# Patient Record
Sex: Male | Born: 1950 | Race: White | Hispanic: No | Marital: Single | Smoking: Never smoker
Health system: Southern US, Community
[De-identification: ages and names within clinical notes are randomized; demographics above are authoritative.]

## PROBLEM LIST (undated history)

## (undated) DIAGNOSIS — I10 Essential (primary) hypertension: Secondary | ICD-10-CM

## (undated) DIAGNOSIS — E785 Hyperlipidemia, unspecified: Secondary | ICD-10-CM

## (undated) HISTORY — PX: WISDOM TOOTH EXTRACTION: SHX21

## (undated) HISTORY — PX: COLONOSCOPY: SHX174

## (undated) HISTORY — DX: Hyperlipidemia, unspecified: E78.5

## (undated) HISTORY — DX: Essential (primary) hypertension: I10

## (undated) HISTORY — PX: OTHER SURGICAL HISTORY: SHX169

## (undated) HISTORY — PX: POLYPECTOMY: SHX149

---
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
## 1976-10-30 HISTORY — PX: HEMORROIDECTOMY: SUR656

## 2004-09-20 ENCOUNTER — Ambulatory Visit: Payer: Self-pay | Admitting: Internal Medicine

## 2004-10-30 HISTORY — PX: DENTAL SURGERY: SHX609

## 2012-11-15 ENCOUNTER — Encounter: Payer: Self-pay | Admitting: Internal Medicine

## 2012-12-20 ENCOUNTER — Ambulatory Visit (AMBULATORY_SURGERY_CENTER): Payer: 59 | Admitting: *Deleted

## 2012-12-20 ENCOUNTER — Telehealth: Payer: Self-pay | Admitting: *Deleted

## 2012-12-20 VITALS — Ht 69.0 in | Wt 200.0 lb

## 2012-12-20 DIAGNOSIS — Z1211 Encounter for screening for malignant neoplasm of colon: Secondary | ICD-10-CM

## 2012-12-20 MED ORDER — MOVIPREP 100 G PO SOLR: ORAL | Status: DC

## 2012-12-20 NOTE — Telephone Encounter (Signed)
Medical release for given to Leslie Wrenn for colon and path report in 2006 or 2007 at [facility] 

## 2012-12-20 NOTE — Progress Notes (Signed)
Medical release for given to Alonna Buckler for colon and path report in 2006 or 2007 at [facility]

## 2012-12-23 ENCOUNTER — Encounter: Payer: Self-pay | Admitting: Internal Medicine

## 2012-12-24 ENCOUNTER — Telehealth: Payer: Self-pay | Admitting: Internal Medicine

## 2012-12-24 NOTE — Telephone Encounter (Signed)
Received 14 pages, [facility]. Sent to Dr. Marina Goodell. 12/24/12/sd

## 2012-12-26 ENCOUNTER — Encounter: Payer: Self-pay | Admitting: Internal Medicine

## 2012-12-26 ENCOUNTER — Ambulatory Visit (AMBULATORY_SURGERY_CENTER): Payer: 59 | Admitting: Internal Medicine

## 2012-12-26 VITALS — BP 109/74 | HR 53 | Temp 97.0°F | Resp 11 | Ht 69.0 in | Wt 200.0 lb

## 2012-12-26 DIAGNOSIS — Z86010 Personal history of colonic polyps: Secondary | ICD-10-CM

## 2012-12-26 DIAGNOSIS — D126 Benign neoplasm of colon, unspecified: Secondary | ICD-10-CM

## 2012-12-26 DIAGNOSIS — Z1211 Encounter for screening for malignant neoplasm of colon: Secondary | ICD-10-CM

## 2012-12-26 MED ORDER — SODIUM CHLORIDE 0.9 % IV SOLN: 500.0000 mL | INTRAVENOUS | Status: DC

## 2012-12-26 NOTE — Patient Instructions (Addendum)

## 2012-12-26 NOTE — Progress Notes (Signed)
Patient did not experience any of the following events: a burn prior to discharge; a fall within the facility; wrong site/side/patient/procedure/implant event; or a hospital transfer or hospital admission upon discharge from the facility. (G8907) Patient did not have preoperative order for IV antibiotic SSI prophylaxis. (G8918)  

## 2012-12-26 NOTE — Progress Notes (Signed)
Called to room to assist during endoscopic procedure.  Patient ID and intended procedure confirmed with present staff. Received instructions for my participation in the procedure from the performing physician.  

## 2012-12-26 NOTE — Progress Notes (Signed)
Lidocaine-40mg IV prior to Propofol InductionPropofol given over incremental dosages 

## 2012-12-26 NOTE — Op Note (Signed)
[facility] [address] [place], [address]   COLONOSCOPY PROCEDURE REPORT  PATIENT: Dylan Norton, Dylan Norton  MR#: [id number] BIRTHDATE: Oct 03, 1951 , 61  yrs. old GENDER: Male ENDOSCOPIST: Roxy Cedar, MD REFERRED NF:[id number] Eloise Harman, M.D. PROCEDURE DATE:  12/26/2012 PROCEDURE:   Colonoscopy with snare polypectomy    x 2 ASA CLASS:   Class II INDICATIONS:Patient's personal history of adenomatous colon polyps. Index exam December 2007 in [place] with Dr. Dan Humphreys. 6 mm sessile serrated adenoma in the sigmoid colon and diverticulosis MEDICATIONS: MAC sedation, administered by CRNA and propofol (Diprivan) 300mg  IV  DESCRIPTION OF PROCEDURE:   After the risks benefits and alternatives of the procedure were thoroughly explained, informed consent was obtained.  A digital rectal exam revealed no abnormalities of the rectum.   The LB [id number]  endoscope was introduced through the anus and advanced to the cecum, which was identified by both the appendix and ileocecal valve. No adverse events experienced.   The quality of the prep was excellent, using MoviPrep  The instrument was then slowly withdrawn as the colon was fully examined.      COLON FINDINGS: Two diminutive polyps were found at the cecum and in the ascending colon.  A polypectomy was performed with a cold snare.  The resection was complete and the polyp tissue was completely retrieved.   Severe diverticulosis was noted in the transverse colon and The finding was left colon.   The colon mucosa was otherwise normal.  Retroflexed views revealed internal hemorrhoids. The time to cecum=4 minutes 02 seconds.  Withdrawal time=10 minutes 30 seconds.  The scope was withdrawn and the procedure completed. COMPLICATIONS: There were no complications.  ENDOSCOPIC IMPRESSION: 1.   Two diminutive polyps were found at the cecum and in the ascending colon; polypectomy was performed with a cold snare 2.   Severe  diverticulosis was noted in the transverse colon and left colon 3.   The colon mucosa was otherwise normal  RECOMMENDATIONS: 1. Follow up colonoscopy in 5 years   eSigned:  Roxy Cedar, MD 12/26/2012 9:20 AM cc: Jarome Matin, MD and The Patient   PATIENT NAME:  [name], [name] MR#: [id number]

## 2012-12-27 ENCOUNTER — Telehealth: Payer: Self-pay | Admitting: *Deleted

## 2012-12-27 NOTE — Telephone Encounter (Signed)
  Follow up Call-  Call back number 12/26/2012  Post procedure Call Back phone  # [phone]  Permission to leave phone message Yes     Patient questions:  Do you have a fever, pain , or abdominal swelling? no Pain Score  0 *  Have you tolerated food without any problems? yes  Have you been able to return to your normal activities? yes  Do you have any questions about your discharge instructions: Diet   no Medications  no Follow up visit  no  Do you have questions or concerns about your Care? no  Actions: * If pain score is 4 or above: No action needed, pain <4.

## 2012-12-31 ENCOUNTER — Encounter: Payer: Self-pay | Admitting: Internal Medicine

## 2014-05-25 ENCOUNTER — Other Ambulatory Visit: Payer: Self-pay | Admitting: Dermatology

## 2014-08-30 DEATH — deceased

## 2017-09-17 DIAGNOSIS — E782 Mixed hyperlipidemia: Secondary | ICD-10-CM | POA: Diagnosis not present

## 2017-09-17 DIAGNOSIS — E559 Vitamin D deficiency, unspecified: Secondary | ICD-10-CM | POA: Diagnosis not present

## 2017-11-26 DIAGNOSIS — D485 Neoplasm of uncertain behavior of skin: Secondary | ICD-10-CM | POA: Diagnosis not present

## 2017-11-26 DIAGNOSIS — Z85828 Personal history of other malignant neoplasm of skin: Secondary | ICD-10-CM | POA: Diagnosis not present

## 2017-11-26 DIAGNOSIS — L570 Actinic keratosis: Secondary | ICD-10-CM | POA: Diagnosis not present

## 2017-11-26 DIAGNOSIS — D225 Melanocytic nevi of trunk: Secondary | ICD-10-CM | POA: Diagnosis not present

## 2017-11-26 DIAGNOSIS — L739 Follicular disorder, unspecified: Secondary | ICD-10-CM | POA: Diagnosis not present

## 2017-11-26 DIAGNOSIS — L821 Other seborrheic keratosis: Secondary | ICD-10-CM | POA: Diagnosis not present

## 2017-12-17 DIAGNOSIS — Z125 Encounter for screening for malignant neoplasm of prostate: Secondary | ICD-10-CM | POA: Diagnosis not present

## 2017-12-17 DIAGNOSIS — I10 Essential (primary) hypertension: Secondary | ICD-10-CM | POA: Diagnosis not present

## 2017-12-17 DIAGNOSIS — Z0000 Encounter for general adult medical examination without abnormal findings: Secondary | ICD-10-CM | POA: Diagnosis not present

## 2018-01-05 ENCOUNTER — Encounter: Payer: Self-pay | Admitting: Internal Medicine

## 2018-01-30 ENCOUNTER — Encounter: Payer: Self-pay | Admitting: Internal Medicine

## 2018-02-25 ENCOUNTER — Encounter: Payer: Self-pay | Admitting: Internal Medicine

## 2018-04-29 ENCOUNTER — Other Ambulatory Visit: Payer: Self-pay

## 2018-04-29 ENCOUNTER — Ambulatory Visit (AMBULATORY_SURGERY_CENTER): Payer: Self-pay | Admitting: *Deleted

## 2018-04-29 ENCOUNTER — Encounter: Payer: Self-pay | Admitting: Internal Medicine

## 2018-04-29 VITALS — Ht 68.5 in | Wt 167.8 lb

## 2018-04-29 DIAGNOSIS — Z86010 Personal history of colonic polyps: Secondary | ICD-10-CM

## 2018-04-29 MED ORDER — PEG-KCL-NACL-NASULF-NA ASC-C 140 G PO SOLR: 1.0000 | ORAL | 0 refills | Status: DC

## 2018-04-29 NOTE — Progress Notes (Signed)
No egg or soy allergy known to patient  No issues with past sedation with any surgeries  or procedures, no intubation problems  No diet pills per patient No home 02 use per patient  No blood thinners per patient  Pt denies issues with constipation  No A fib or A flutter  EMMI video sent to pt's e mail - pt declined  Plenvu sample given due to insurance  Lot [id number] exp 04-2019 as directed

## 2018-05-13 ENCOUNTER — Other Ambulatory Visit: Payer: Self-pay

## 2018-05-13 ENCOUNTER — Ambulatory Visit (AMBULATORY_SURGERY_CENTER): Payer: Medicare Other | Admitting: Internal Medicine

## 2018-05-13 ENCOUNTER — Encounter: Payer: Self-pay | Admitting: Internal Medicine

## 2018-05-13 VITALS — BP 154/78 | HR 52 | Temp 99.1°F | Resp 13 | Ht 68.0 in | Wt 167.0 lb

## 2018-05-13 DIAGNOSIS — Z86010 Personal history of colonic polyps: Secondary | ICD-10-CM | POA: Diagnosis not present

## 2018-05-13 DIAGNOSIS — Z1211 Encounter for screening for malignant neoplasm of colon: Secondary | ICD-10-CM | POA: Diagnosis not present

## 2018-05-13 MED ORDER — SODIUM CHLORIDE 0.9 % IV SOLN: 500.0000 mL | Freq: Once | INTRAVENOUS | Status: AC

## 2018-05-13 NOTE — Op Note (Signed)
[facility] Patient Name: Dylan Norton Procedure Date: 05/13/2018 9:37 AM MRN: [id number] Endoscopist: Docia Chuck. Henrene Pastor , MD Age: 67 Referring MD:  Date of Birth: Dec 24, 1950 Gender: Male Account #: [id number] Procedure:                Colonoscopy Indications:              High risk colon cancer surveillance: Personal                            history of non-advanced adenoma, High risk colon                            cancer surveillance: Personal history of sessile                            serrated colon polyp (less than 10 mm in size) with                            no dysplasia. Previous examinations 2007                            (elsewhere) and 2014 Medicines:                Monitored Anesthesia Care Procedure:                Pre-Anesthesia Assessment:                           - Prior to the procedure, a History and Physical                            was performed, and patient medications and                            allergies were reviewed. The patient's tolerance of                            previous anesthesia was also reviewed. The risks                            and benefits of the procedure and the sedation                            options and risks were discussed with the patient.                            All questions were answered, and informed consent                            was obtained. Prior Anticoagulants: The patient has                            taken no previous anticoagulant or antiplatelet  agents. ASA Grade Assessment: II - A patient with                            mild systemic disease. After reviewing the risks                            and benefits, the patient was deemed in                            satisfactory condition to undergo the procedure.                           After obtaining informed consent, the colonoscope                            was passed under direct vision. Throughout the                   procedure, the patient's blood pressure, pulse, and                            oxygen saturations were monitored continuously. The                            Colonoscope was introduced through the anus and                            advanced to the the cecum, identified by                            appendiceal orifice and ileocecal valve. The                            ileocecal valve, appendiceal orifice, and rectum                            were photographed. The quality of the bowel                            preparation was good. The colonoscopy was performed                            without difficulty. The patient tolerated the                            procedure well. The bowel preparation used was                            SUPREP. Scope In: 9:50:25 AM Scope Out: 10:03:20 AM Scope Withdrawal Time: 0 hours 8 minutes 12 seconds  Total Procedure Duration: 0 hours 12 minutes 55 seconds  Findings:                 Multiple small and large-mouthed diverticula were  found in the left colon.                           Internal hemorrhoids were found during retroflexion.                           The exam was otherwise without abnormality on                            direct and retroflexion views. Complications:            No immediate complications. Estimated blood loss:                            None. Estimated Blood Loss:     Estimated blood loss: none. Impression:               - Diverticulosis in the left colon.                           - Internal hemorrhoids.                           - The examination was otherwise normal on direct                            and retroflexion views.                           - No specimens collected. Recommendation:           - Repeat colonoscopy in 5 years for surveillance                            (personal history of multiple adenomas and SSP).                           - Patient has a contact  number available for                            emergencies. The signs and symptoms of potential                            delayed complications were discussed with the                            patient. Return to normal activities tomorrow.                            Written discharge instructions were provided to the                            patient.                           - Resume previous diet.                           -  Continue present medications. Docia Chuck. Henrene Pastor, MD 05/13/2018 10:07:58 AM This report has been signed electronically.

## 2018-05-13 NOTE — Progress Notes (Signed)
A and O x3. Report to RN. Tolerated MAC anesthesia well.

## 2018-05-13 NOTE — Patient Instructions (Signed)
Handouts given:  Diverticulosis  YOU HAD AN ENDOSCOPIC PROCEDURE TODAY AT [facility]:   Refer to the procedure report that was given to you for any specific questions about what was found during the examination.  If the procedure report does not answer your questions, please call your gastroenterologist to clarify.  If you requested that your care partner not be given the details of your procedure findings, then the procedure report has been included in a sealed envelope for you to review at your convenience later.  YOU SHOULD EXPECT: Some feelings of bloating in the abdomen. Passage of more gas than usual.  Walking can help get rid of the air that was put into your GI tract during the procedure and reduce the bloating. If you had a lower endoscopy (such as a colonoscopy or flexible sigmoidoscopy) you may notice spotting of blood in your stool or on the toilet paper. If you underwent a bowel prep for your procedure, you may not have a normal bowel movement for a few days.  Please Note:  You might notice some irritation and congestion in your nose or some drainage.  This is from the oxygen used during your procedure.  There is no need for concern and it should clear up in a day or so.  SYMPTOMS TO REPORT IMMEDIATELY:   Following lower endoscopy (colonoscopy or flexible sigmoidoscopy):  Excessive amounts of blood in the stool  Significant tenderness or worsening of abdominal pains  Swelling of the abdomen that is new, acute  Fever of 100F or higher   For urgent or emergent issues, a gastroenterologist can be reached at any hour by calling [phone].   DIET:  We do recommend a small meal at first, but then you may proceed to your regular diet.  Drink plenty of fluids but you should avoid alcoholic beverages for 24 hours.  ACTIVITY:  You should plan to take it easy for the rest of today and you should NOT DRIVE or use heavy machinery until tomorrow (because of the sedation  medicines used during the test).    FOLLOW UP: Our staff will call the number listed on your records the next business day following your procedure to check on you and address any questions or concerns that you may have regarding the information given to you following your procedure. If we do not reach you, we will leave a message.  However, if you are feeling well and you are not experiencing any problems, there is no need to return our call.  We will assume that you have returned to your regular daily activities without incident.  If any biopsies were taken you will be contacted by phone or by letter within the next 1-3 weeks.  Please call us at [phone] if you have not heard about the biopsies in 3 weeks.    SIGNATURES/CONFIDENTIALITY: You and/or your care partner have signed paperwork which will be entered into your electronic medical record.  These signatures attest to the fact that that the information above on your After Visit Summary has been reviewed and is understood.  Full responsibility of the confidentiality of this discharge information lies with you and/or your care-partner.

## 2018-05-14 ENCOUNTER — Telehealth: Payer: Self-pay

## 2018-05-14 NOTE — Telephone Encounter (Signed)
  Follow up Call-  Call back number 05/13/2018  Post procedure Call Back phone  # [phone]  Permission to leave phone message Yes  Some recent data might be hidden     LMOM; will try again later today Angela/Call-back/Recovery

## 2018-05-14 NOTE — Telephone Encounter (Signed)
  Follow up Call-  Call back number 05/13/2018  Post procedure Call Back phone  # [phone]  Permission to leave phone message Yes  Some recent data might be hidden     Patient questions:  Do you have a fever, pain , or abdominal swelling? No. Pain Score  0 *  Have you tolerated food without any problems? Yes.    Have you been able to return to your normal activities? Yes.    Do you have any questions about your discharge instructions: Diet   No. Medications  No. Follow up visit  No.  Do you have questions or concerns about your Care? No.  Actions: * If pain score is 4 or above: No action needed, pain <4.

## 2018-05-27 DIAGNOSIS — Z85828 Personal history of other malignant neoplasm of skin: Secondary | ICD-10-CM | POA: Diagnosis not present

## 2018-05-27 DIAGNOSIS — D225 Melanocytic nevi of trunk: Secondary | ICD-10-CM | POA: Diagnosis not present

## 2018-05-27 DIAGNOSIS — D2262 Melanocytic nevi of left upper limb, including shoulder: Secondary | ICD-10-CM | POA: Diagnosis not present

## 2018-05-27 DIAGNOSIS — L821 Other seborrheic keratosis: Secondary | ICD-10-CM | POA: Diagnosis not present

## 2018-06-17 DIAGNOSIS — C61 Malignant neoplasm of prostate: Secondary | ICD-10-CM | POA: Diagnosis not present

## 2018-06-24 DIAGNOSIS — N400 Enlarged prostate without lower urinary tract symptoms: Secondary | ICD-10-CM | POA: Diagnosis not present

## 2018-06-24 DIAGNOSIS — C61 Malignant neoplasm of prostate: Secondary | ICD-10-CM | POA: Diagnosis not present

## 2018-08-03 DIAGNOSIS — Z23 Encounter for immunization: Secondary | ICD-10-CM | POA: Diagnosis not present

## 2018-08-30 DIAGNOSIS — M7582 Other shoulder lesions, left shoulder: Secondary | ICD-10-CM | POA: Diagnosis not present

## 2018-08-30 DIAGNOSIS — M542 Cervicalgia: Secondary | ICD-10-CM | POA: Diagnosis not present

## 2018-11-08 DIAGNOSIS — H2513 Age-related nuclear cataract, bilateral: Secondary | ICD-10-CM | POA: Diagnosis not present

## 2018-11-08 DIAGNOSIS — H5203 Hypermetropia, bilateral: Secondary | ICD-10-CM | POA: Diagnosis not present

## 2018-11-08 DIAGNOSIS — H52203 Unspecified astigmatism, bilateral: Secondary | ICD-10-CM | POA: Diagnosis not present

## 2018-11-08 DIAGNOSIS — H524 Presbyopia: Secondary | ICD-10-CM | POA: Diagnosis not present

## 2018-12-10 DIAGNOSIS — Z0120 Encounter for dental examination and cleaning without abnormal findings: Secondary | ICD-10-CM | POA: Diagnosis not present

## 2018-12-20 DIAGNOSIS — R82998 Other abnormal findings in urine: Secondary | ICD-10-CM | POA: Diagnosis not present

## 2018-12-20 DIAGNOSIS — Z0000 Encounter for general adult medical examination without abnormal findings: Secondary | ICD-10-CM | POA: Diagnosis not present

## 2018-12-20 DIAGNOSIS — R809 Proteinuria, unspecified: Secondary | ICD-10-CM | POA: Diagnosis not present

## 2018-12-20 DIAGNOSIS — Z125 Encounter for screening for malignant neoplasm of prostate: Secondary | ICD-10-CM | POA: Diagnosis not present

## 2018-12-27 DIAGNOSIS — R972 Elevated prostate specific antigen [PSA]: Secondary | ICD-10-CM | POA: Diagnosis not present

## 2018-12-30 DIAGNOSIS — Z1212 Encounter for screening for malignant neoplasm of rectum: Secondary | ICD-10-CM | POA: Diagnosis not present

## 2019-01-07 ENCOUNTER — Other Ambulatory Visit: Payer: Self-pay | Admitting: Urology

## 2019-01-07 DIAGNOSIS — R972 Elevated prostate specific antigen [PSA]: Secondary | ICD-10-CM | POA: Diagnosis not present

## 2019-01-07 DIAGNOSIS — C61 Malignant neoplasm of prostate: Secondary | ICD-10-CM | POA: Diagnosis not present

## 2019-01-07 DIAGNOSIS — N400 Enlarged prostate without lower urinary tract symptoms: Secondary | ICD-10-CM | POA: Diagnosis not present

## 2019-01-21 ENCOUNTER — Other Ambulatory Visit: Payer: Self-pay

## 2019-01-21 ENCOUNTER — Ambulatory Visit
Admission: RE | Admit: 2019-01-21 | Discharge: 2019-01-21 | Disposition: A | Discharge status: Home / Self Care | Payer: Medicare Other | Source: Ambulatory Visit | Attending: Urology | Admitting: Urology

## 2019-01-21 DIAGNOSIS — C61 Malignant neoplasm of prostate: Secondary | ICD-10-CM | POA: Diagnosis not present

## 2019-01-21 MED ORDER — GADOBENATE DIMEGLUMINE 529 MG/ML IV SOLN
15.0000 mL | Freq: Once | INTRAVENOUS | Status: AC | PRN
  Administered 2019-01-21: 15 mL via INTRAVENOUS

## 2019-04-14 DIAGNOSIS — C61 Malignant neoplasm of prostate: Secondary | ICD-10-CM | POA: Diagnosis not present

## 2019-05-26 DIAGNOSIS — D2372 Other benign neoplasm of skin of left lower limb, including hip: Secondary | ICD-10-CM | POA: Diagnosis not present

## 2019-05-26 DIAGNOSIS — D1801 Hemangioma of skin and subcutaneous tissue: Secondary | ICD-10-CM | POA: Diagnosis not present

## 2019-05-26 DIAGNOSIS — D225 Melanocytic nevi of trunk: Secondary | ICD-10-CM | POA: Diagnosis not present

## 2019-05-26 DIAGNOSIS — L821 Other seborrheic keratosis: Secondary | ICD-10-CM | POA: Diagnosis not present

## 2019-07-03 DIAGNOSIS — N400 Enlarged prostate without lower urinary tract symptoms: Secondary | ICD-10-CM | POA: Diagnosis not present

## 2019-07-03 DIAGNOSIS — C61 Malignant neoplasm of prostate: Secondary | ICD-10-CM | POA: Diagnosis not present

## 2019-07-12 DIAGNOSIS — Z23 Encounter for immunization: Secondary | ICD-10-CM | POA: Diagnosis not present

## 2019-11-10 DIAGNOSIS — H52203 Unspecified astigmatism, bilateral: Secondary | ICD-10-CM | POA: Diagnosis not present

## 2019-11-10 DIAGNOSIS — H5203 Hypermetropia, bilateral: Secondary | ICD-10-CM | POA: Diagnosis not present

## 2019-11-10 DIAGNOSIS — H2513 Age-related nuclear cataract, bilateral: Secondary | ICD-10-CM | POA: Diagnosis not present

## 2019-11-10 DIAGNOSIS — H524 Presbyopia: Secondary | ICD-10-CM | POA: Diagnosis not present

## 2019-11-18 ENCOUNTER — Other Ambulatory Visit: Payer: Self-pay

## 2019-11-22 ENCOUNTER — Ambulatory Visit: Payer: Medicare Other | Attending: Internal Medicine

## 2019-11-22 DIAGNOSIS — Z23 Encounter for immunization: Secondary | ICD-10-CM | POA: Insufficient documentation

## 2019-11-22 NOTE — Progress Notes (Signed)
   Covid-19 Vaccination Clinic  Name:  Dylan Norton    MRN: [id number] DOB: September 07, 1951  11/22/2019  Dylan Norton was observed post Covid-19 immunization for 15 minutes without incidence. He was provided with Vaccine Information Sheet and instruction to access the V-Safe system.   Dylan Norton was instructed to call 911 with any severe reactions post vaccine: Marland Kitchen Difficulty breathing  . Swelling of your face and throat  . A fast heartbeat  . A bad rash all over your body  . Dizziness and weakness    Immunizations Administered    Name Date Dose VIS Date Route   Pfizer COVID-19 Vaccine 11/22/2019 12:41 PM 0.3 mL 10/10/2019 Intramuscular   Manufacturer: [place]   Lot: [id number]   [place]: [id number]

## 2019-12-13 ENCOUNTER — Ambulatory Visit: Payer: Medicare Other | Attending: Internal Medicine

## 2019-12-13 DIAGNOSIS — Z23 Encounter for immunization: Secondary | ICD-10-CM | POA: Insufficient documentation

## 2019-12-13 NOTE — Progress Notes (Signed)
   Covid-19 Vaccination Clinic  Name:  Dylan Norton    MRN: [id number] DOB: 07-17-1951  12/13/2019  Dylan Norton was observed post Covid-19 immunization for 15 minutes without incidence. He was provided with Vaccine Information Sheet and instruction to access the V-Safe system.   Dylan Norton was instructed to call 911 with any severe reactions post vaccine: Marland Kitchen Difficulty breathing  . Swelling of your face and throat  . A fast heartbeat  . A bad rash all over your body  . Dizziness and weakness    Immunizations Administered    Name Date Dose VIS Date Route   Pfizer COVID-19 Vaccine 12/13/2019 10:45 AM 0.3 mL 10/10/2019 Intramuscular   Manufacturer: [employer]   Lot: [id number]   [place]: [id number]

## 2020-01-02 DIAGNOSIS — Z125 Encounter for screening for malignant neoplasm of prostate: Secondary | ICD-10-CM | POA: Diagnosis not present

## 2020-01-02 DIAGNOSIS — Z0000 Encounter for general adult medical examination without abnormal findings: Secondary | ICD-10-CM | POA: Diagnosis not present

## 2020-01-02 DIAGNOSIS — E7849 Other hyperlipidemia: Secondary | ICD-10-CM | POA: Diagnosis not present

## 2020-01-09 DIAGNOSIS — I10 Essential (primary) hypertension: Secondary | ICD-10-CM | POA: Diagnosis not present

## 2020-01-09 DIAGNOSIS — R82998 Other abnormal findings in urine: Secondary | ICD-10-CM | POA: Diagnosis not present

## 2020-01-23 DIAGNOSIS — Z1212 Encounter for screening for malignant neoplasm of rectum: Secondary | ICD-10-CM | POA: Diagnosis not present

## 2020-05-17 DIAGNOSIS — D2261 Melanocytic nevi of right upper limb, including shoulder: Secondary | ICD-10-CM | POA: Diagnosis not present

## 2020-05-17 DIAGNOSIS — L820 Inflamed seborrheic keratosis: Secondary | ICD-10-CM | POA: Diagnosis not present

## 2020-05-17 DIAGNOSIS — L821 Other seborrheic keratosis: Secondary | ICD-10-CM | POA: Diagnosis not present

## 2020-05-17 DIAGNOSIS — D225 Melanocytic nevi of trunk: Secondary | ICD-10-CM | POA: Diagnosis not present

## 2020-05-17 DIAGNOSIS — D1801 Hemangioma of skin and subcutaneous tissue: Secondary | ICD-10-CM | POA: Diagnosis not present

## 2020-06-15 DIAGNOSIS — Z0120 Encounter for dental examination and cleaning without abnormal findings: Secondary | ICD-10-CM | POA: Diagnosis not present

## 2020-07-08 DIAGNOSIS — C61 Malignant neoplasm of prostate: Secondary | ICD-10-CM | POA: Diagnosis not present

## 2020-07-15 DIAGNOSIS — C61 Malignant neoplasm of prostate: Secondary | ICD-10-CM | POA: Diagnosis not present

## 2020-07-15 DIAGNOSIS — R972 Elevated prostate specific antigen [PSA]: Secondary | ICD-10-CM | POA: Diagnosis not present

## 2020-07-16 ENCOUNTER — Other Ambulatory Visit: Payer: Self-pay | Admitting: Urology

## 2020-07-16 DIAGNOSIS — C61 Malignant neoplasm of prostate: Secondary | ICD-10-CM

## 2020-07-26 DIAGNOSIS — Z23 Encounter for immunization: Secondary | ICD-10-CM | POA: Diagnosis not present

## 2020-07-28 ENCOUNTER — Other Ambulatory Visit: Payer: Self-pay

## 2020-07-28 ENCOUNTER — Ambulatory Visit
Admission: RE | Admit: 2020-07-28 | Discharge: 2020-07-28 | Disposition: A | Discharge status: Home / Self Care | Payer: Medicare Other | Source: Ambulatory Visit | Attending: Urology | Admitting: Urology

## 2020-07-28 DIAGNOSIS — C61 Malignant neoplasm of prostate: Secondary | ICD-10-CM

## 2020-07-28 DIAGNOSIS — R972 Elevated prostate specific antigen [PSA]: Secondary | ICD-10-CM | POA: Diagnosis not present

## 2020-07-28 MED ORDER — GADOBENATE DIMEGLUMINE 529 MG/ML IV SOLN
15.0000 mL | Freq: Once | INTRAVENOUS | Status: AC | PRN
  Administered 2020-07-28: 15 mL via INTRAVENOUS

## 2020-07-30 DIAGNOSIS — M79671 Pain in right foot: Secondary | ICD-10-CM | POA: Diagnosis not present

## 2020-07-30 DIAGNOSIS — M205X9 Other deformities of toe(s) (acquired), unspecified foot: Secondary | ICD-10-CM | POA: Insufficient documentation

## 2020-07-30 DIAGNOSIS — M205X1 Other deformities of toe(s) (acquired), right foot: Secondary | ICD-10-CM | POA: Diagnosis not present

## 2020-07-30 DIAGNOSIS — M79672 Pain in left foot: Secondary | ICD-10-CM | POA: Diagnosis not present

## 2020-10-12 DIAGNOSIS — H524 Presbyopia: Secondary | ICD-10-CM | POA: Diagnosis not present

## 2020-10-12 DIAGNOSIS — H2513 Age-related nuclear cataract, bilateral: Secondary | ICD-10-CM | POA: Diagnosis not present

## 2020-10-12 DIAGNOSIS — H52203 Unspecified astigmatism, bilateral: Secondary | ICD-10-CM | POA: Diagnosis not present

## 2020-10-12 DIAGNOSIS — H5203 Hypermetropia, bilateral: Secondary | ICD-10-CM | POA: Diagnosis not present

## 2020-12-20 DIAGNOSIS — M65341 Trigger finger, right ring finger: Secondary | ICD-10-CM | POA: Diagnosis not present

## 2020-12-20 DIAGNOSIS — M79641 Pain in right hand: Secondary | ICD-10-CM | POA: Insufficient documentation

## 2020-12-20 DIAGNOSIS — M13841 Other specified arthritis, right hand: Secondary | ICD-10-CM | POA: Diagnosis not present

## 2020-12-20 DIAGNOSIS — M6530 Trigger finger, unspecified finger: Secondary | ICD-10-CM | POA: Insufficient documentation

## 2021-01-14 DIAGNOSIS — Z125 Encounter for screening for malignant neoplasm of prostate: Secondary | ICD-10-CM | POA: Diagnosis not present

## 2021-01-14 DIAGNOSIS — E785 Hyperlipidemia, unspecified: Secondary | ICD-10-CM | POA: Diagnosis not present

## 2021-01-21 DIAGNOSIS — Z1212 Encounter for screening for malignant neoplasm of rectum: Secondary | ICD-10-CM | POA: Diagnosis not present

## 2021-01-21 DIAGNOSIS — R82998 Other abnormal findings in urine: Secondary | ICD-10-CM | POA: Diagnosis not present

## 2021-01-21 DIAGNOSIS — I10 Essential (primary) hypertension: Secondary | ICD-10-CM | POA: Diagnosis not present

## 2021-03-24 DIAGNOSIS — M65341 Trigger finger, right ring finger: Secondary | ICD-10-CM | POA: Diagnosis not present

## 2021-05-18 DIAGNOSIS — L570 Actinic keratosis: Secondary | ICD-10-CM | POA: Insufficient documentation

## 2021-05-18 DIAGNOSIS — M25572 Pain in left ankle and joints of left foot: Secondary | ICD-10-CM | POA: Diagnosis not present

## 2021-05-18 DIAGNOSIS — M79671 Pain in right foot: Secondary | ICD-10-CM | POA: Diagnosis not present

## 2021-05-18 DIAGNOSIS — S92413A Displaced fracture of proximal phalanx of unspecified great toe, initial encounter for closed fracture: Secondary | ICD-10-CM | POA: Insufficient documentation

## 2021-05-18 DIAGNOSIS — M21542 Acquired clubfoot, left foot: Secondary | ICD-10-CM | POA: Insufficient documentation

## 2021-05-18 DIAGNOSIS — M79672 Pain in left foot: Secondary | ICD-10-CM | POA: Diagnosis not present

## 2021-05-18 DIAGNOSIS — M25571 Pain in right ankle and joints of right foot: Secondary | ICD-10-CM | POA: Diagnosis not present

## 2021-05-19 DIAGNOSIS — L814 Other melanin hyperpigmentation: Secondary | ICD-10-CM | POA: Diagnosis not present

## 2021-05-19 DIAGNOSIS — D692 Other nonthrombocytopenic purpura: Secondary | ICD-10-CM | POA: Diagnosis not present

## 2021-05-19 DIAGNOSIS — L821 Other seborrheic keratosis: Secondary | ICD-10-CM | POA: Diagnosis not present

## 2021-05-19 DIAGNOSIS — D225 Melanocytic nevi of trunk: Secondary | ICD-10-CM | POA: Diagnosis not present

## 2021-06-17 DIAGNOSIS — S92512A Displaced fracture of proximal phalanx of left lesser toe(s), initial encounter for closed fracture: Secondary | ICD-10-CM | POA: Diagnosis not present

## 2021-06-17 DIAGNOSIS — M21549 Acquired clubfoot, unspecified foot: Secondary | ICD-10-CM | POA: Diagnosis not present

## 2021-06-17 DIAGNOSIS — L84 Corns and callosities: Secondary | ICD-10-CM | POA: Diagnosis not present

## 2021-07-19 DIAGNOSIS — M21549 Acquired clubfoot, unspecified foot: Secondary | ICD-10-CM | POA: Diagnosis not present

## 2021-07-19 DIAGNOSIS — C61 Malignant neoplasm of prostate: Secondary | ICD-10-CM | POA: Diagnosis not present

## 2021-07-25 DIAGNOSIS — R351 Nocturia: Secondary | ICD-10-CM | POA: Diagnosis not present

## 2021-07-25 DIAGNOSIS — N401 Enlarged prostate with lower urinary tract symptoms: Secondary | ICD-10-CM | POA: Diagnosis not present

## 2021-07-25 DIAGNOSIS — C61 Malignant neoplasm of prostate: Secondary | ICD-10-CM | POA: Diagnosis not present

## 2021-07-26 ENCOUNTER — Other Ambulatory Visit: Payer: Self-pay | Admitting: Urology

## 2021-07-26 DIAGNOSIS — C61 Malignant neoplasm of prostate: Secondary | ICD-10-CM

## 2021-08-01 DIAGNOSIS — Z23 Encounter for immunization: Secondary | ICD-10-CM | POA: Diagnosis not present

## 2021-08-07 ENCOUNTER — Ambulatory Visit
Admission: RE | Admit: 2021-08-07 | Discharge: 2021-08-07 | Disposition: A | Discharge status: Home / Self Care | Payer: Medicare Other | Source: Ambulatory Visit | Attending: Urology | Admitting: Urology

## 2021-08-07 ENCOUNTER — Other Ambulatory Visit: Payer: Self-pay

## 2021-08-07 DIAGNOSIS — K5730 Diverticulosis of large intestine without perforation or abscess without bleeding: Secondary | ICD-10-CM | POA: Diagnosis not present

## 2021-08-07 DIAGNOSIS — N3289 Other specified disorders of bladder: Secondary | ICD-10-CM | POA: Diagnosis not present

## 2021-08-07 DIAGNOSIS — C61 Malignant neoplasm of prostate: Secondary | ICD-10-CM | POA: Diagnosis not present

## 2021-08-07 MED ORDER — GADOBENATE DIMEGLUMINE 529 MG/ML IV SOLN
15.0000 mL | Freq: Once | INTRAVENOUS | Status: AC | PRN
  Administered 2021-08-07: 15 mL via INTRAVENOUS

## 2021-08-10 DIAGNOSIS — C61 Malignant neoplasm of prostate: Secondary | ICD-10-CM | POA: Diagnosis not present

## 2021-10-13 DIAGNOSIS — H524 Presbyopia: Secondary | ICD-10-CM | POA: Diagnosis not present

## 2021-10-13 DIAGNOSIS — H52203 Unspecified astigmatism, bilateral: Secondary | ICD-10-CM | POA: Diagnosis not present

## 2021-10-13 DIAGNOSIS — H5203 Hypermetropia, bilateral: Secondary | ICD-10-CM | POA: Diagnosis not present

## 2021-10-13 DIAGNOSIS — H2513 Age-related nuclear cataract, bilateral: Secondary | ICD-10-CM | POA: Diagnosis not present

## 2022-03-06 DIAGNOSIS — Z125 Encounter for screening for malignant neoplasm of prostate: Secondary | ICD-10-CM | POA: Diagnosis not present

## 2022-03-06 DIAGNOSIS — I10 Essential (primary) hypertension: Secondary | ICD-10-CM | POA: Diagnosis not present

## 2022-03-06 DIAGNOSIS — E785 Hyperlipidemia, unspecified: Secondary | ICD-10-CM | POA: Diagnosis not present

## 2022-03-06 DIAGNOSIS — I2510 Atherosclerotic heart disease of native coronary artery without angina pectoris: Secondary | ICD-10-CM | POA: Diagnosis not present

## 2022-03-13 DIAGNOSIS — R82998 Other abnormal findings in urine: Secondary | ICD-10-CM | POA: Diagnosis not present

## 2022-03-13 DIAGNOSIS — G4700 Insomnia, unspecified: Secondary | ICD-10-CM | POA: Diagnosis not present

## 2022-03-13 DIAGNOSIS — Z0000 Encounter for general adult medical examination without abnormal findings: Secondary | ICD-10-CM | POA: Diagnosis not present

## 2022-03-13 DIAGNOSIS — I10 Essential (primary) hypertension: Secondary | ICD-10-CM | POA: Diagnosis not present

## 2022-03-13 DIAGNOSIS — I2510 Atherosclerotic heart disease of native coronary artery without angina pectoris: Secondary | ICD-10-CM | POA: Diagnosis not present

## 2022-04-24 DIAGNOSIS — I10 Essential (primary) hypertension: Secondary | ICD-10-CM | POA: Diagnosis not present

## 2022-04-26 ENCOUNTER — Ambulatory Visit: Payer: Medicare Other | Admitting: Podiatry

## 2022-04-26 DIAGNOSIS — M21961 Unspecified acquired deformity of right lower leg: Secondary | ICD-10-CM

## 2022-04-26 DIAGNOSIS — M2041 Other hammer toe(s) (acquired), right foot: Secondary | ICD-10-CM | POA: Diagnosis not present

## 2022-04-26 DIAGNOSIS — M2011 Hallux valgus (acquired), right foot: Secondary | ICD-10-CM | POA: Diagnosis not present

## 2022-04-29 ENCOUNTER — Encounter: Payer: Self-pay | Admitting: Podiatry

## 2022-04-29 NOTE — Progress Notes (Signed)
  Subjective:  Patient ID: Dylan Norton, male    DOB: 08-Mar-1951,  MRN: [id number]  Chief Complaint  Patient presents with   Bunions    NP overlapping of halux - 2nd opinion    71 y.o. male presents with the above complaint. History confirmed with patient.  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  His great toe overlaps the second toe.  He saw Dr. Doran Durand and had x-rays and he recommended first metatarsal phalangeal joint fusion, he would not want to have surgery right now.  He has tried multiple types of offloading pads.  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 Hallux interphalangeus, acquired, right   2. Hammertoe of right foot   3. Metatarsal deformity, right      Plan:  Patient was evaluated and treated and all questions answered.  We discussed surgical and nonsurgical treatment options.  He is not interested in surgery currently.  Currently its not particularly painful and limiting for him.  He will continue his nonsurgical treatment with offloading pads.  If he did consider surgery I do not think necessarily a first metatarsophalangeal joint fusion is necessary, he likely could have correction of the deformity with a large Aiken osteotomy, Weil osteotomies of the second third and fourth metatarsals to shorten these and reduce the hammertoe contracture.  He will return to see me as needed if he is interested in surgery.  Return if symptoms worsen or fail to  improve.

## 2022-05-25 DIAGNOSIS — L821 Other seborrheic keratosis: Secondary | ICD-10-CM | POA: Diagnosis not present

## 2022-05-25 DIAGNOSIS — D2261 Melanocytic nevi of right upper limb, including shoulder: Secondary | ICD-10-CM | POA: Diagnosis not present

## 2022-05-25 DIAGNOSIS — D225 Melanocytic nevi of trunk: Secondary | ICD-10-CM | POA: Diagnosis not present

## 2022-05-25 DIAGNOSIS — D2262 Melanocytic nevi of left upper limb, including shoulder: Secondary | ICD-10-CM | POA: Diagnosis not present

## 2022-07-17 DIAGNOSIS — C61 Malignant neoplasm of prostate: Secondary | ICD-10-CM | POA: Diagnosis not present

## 2022-07-23 IMAGING — MR MR PROSTATE WO/W CM
12 series · 48 of 48 positions shown · IV contrast (multihance)
Comparison: 07/28/2020

CLINICAL DATA: Prostate carcinoma, Gleason score 6. Active
surveillance.

EXAM:
MR PROSTATE WITHOUT AND WITH CONTRAST
TECHNIQUE: Multiplanar multisequence MRI images were obtained of the pelvis
centered about the prostate. Pre and post contrast images were
obtained.
CONTRAST:  15mL MULTIHANCE GADOBENATE DIMEGLUMINE 529 MG/ML IV SOLN

[Series 3: T2 · coronal · 3.0mm · 0.56mm/px · 1 of 25 slices shown (1 of 3)]
[im 1/25]
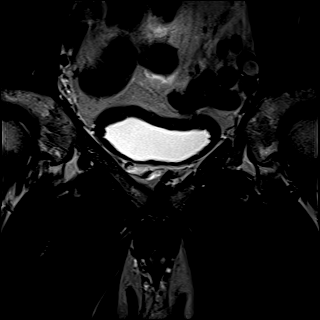

[Series 4: T1 · axial · 5.0mm · 1.25mm/px · 1 of 80 slices shown]
[im 1/80]
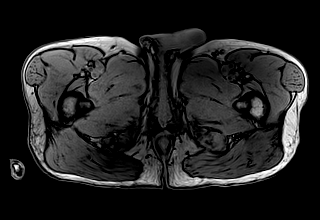

[Series 5: DWI · axial · 3.0mm · 1.75mm/px · 1 of 84 slices shown (1 of 3)]
[im 1/84]
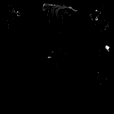

[Series 6: DWI · axial · 3.0mm · 1.75mm/px · 1 of 28 slices shown (2 of 3)]
[im 1/28]
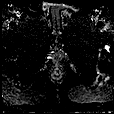

[Series 7: DWI · axial · 3.0mm · 1.75mm/px · 1 of 28 slices shown (3 of 3)]
[im 1/28]
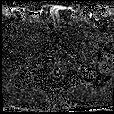

[Series 8: T2 · axial · 3.0mm · 0.62mm/px · 1 of 28 slices shown (2 of 3)]
[im 1/28]
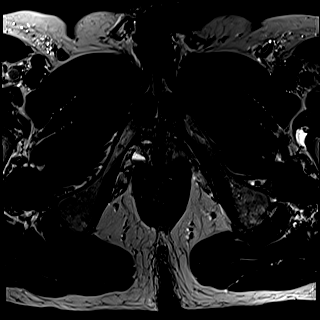

[Series 9: T2 · axial · 1.0mm · 1.04mm/px · z∈[-46,+33]mm · 2 of 80 slices shown (3 of 3)]
[im 1/80]
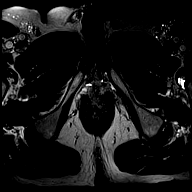
[im 80/80]
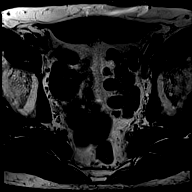

[Series 10: pre t1_twist_tra_dyn · axial · non-contrast · 3.5mm · 0.94mm/px · 1 of 24 slices shown]
[im 1/24]
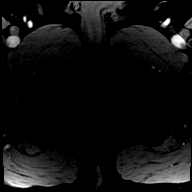

[Series 11: post t1_twist_tra_dyn-copy center · axial · non-contrast · 3.5mm · 0.94mm/px · z∈[-47,+34]mm · 18 of 720 slices shown]
[im 1/720]
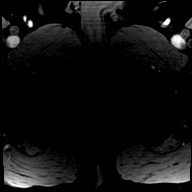
[im 43/720]
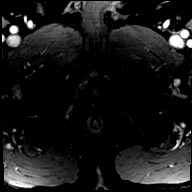
[im 85/720]
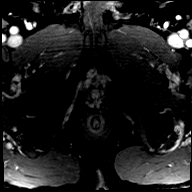
[im 127/720]
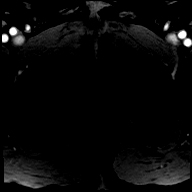
[im 170/720]
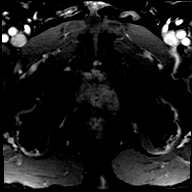
[im 212/720]
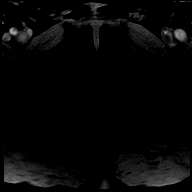
[im 254/720]
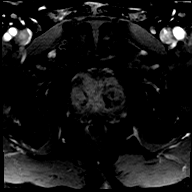
[im 297/720]
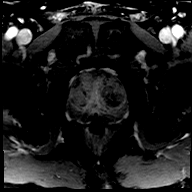
[im 339/720]
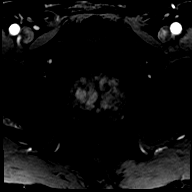
[im 381/720]
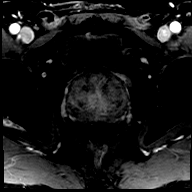
[im 423/720]
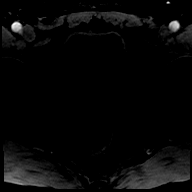
[im 466/720]
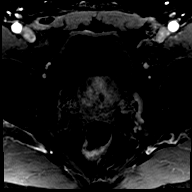
[im 508/720]
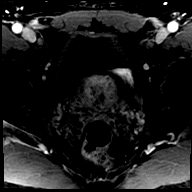
[im 550/720]
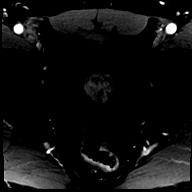
[im 593/720]
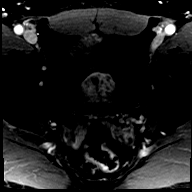
[im 635/720]
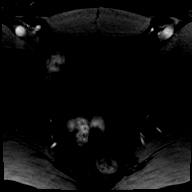
[im 677/720]
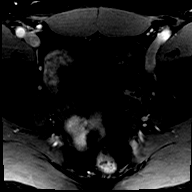
[im 720/720]
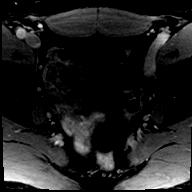

[Series 12: post t1_twist_tra_dyn-copy cent_sub · axial · 3.5mm · 0.94mm/px · z∈[-47,+34]mm · 17 of 696 slices shown]
[im 1/696]
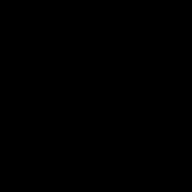
[im 44/696]
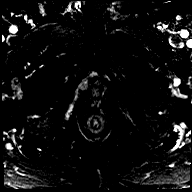
[im 87/696]
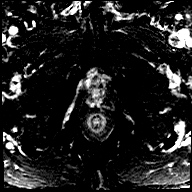
[im 131/696]
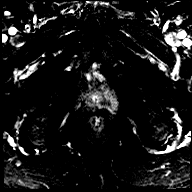
[im 174/696]
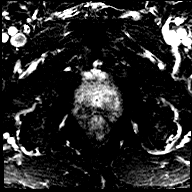
[im 218/696]
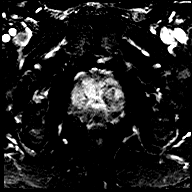
[im 261/696]
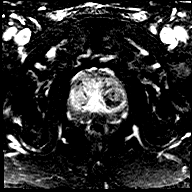
[im 305/696]
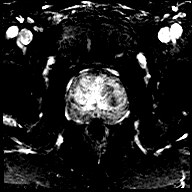
[im 348/696]
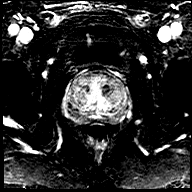
[im 391/696]
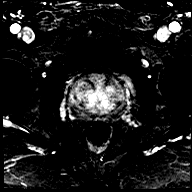
[im 435/696]
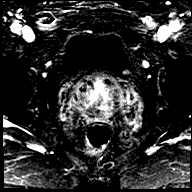
[im 478/696]
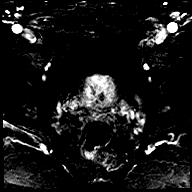
[im 522/696]
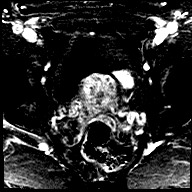
[im 565/696]
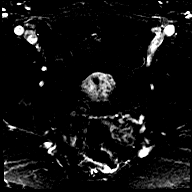
[im 609/696]
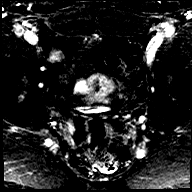
[im 652/696]
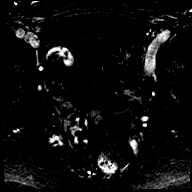
[im 696/696]
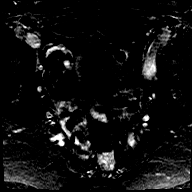

[Series 13: t1_vibe_dixon_tra_f · axial · 2.5mm · 0.91mm/px · z∈[-54,+124]mm · 2 of 72 slices shown]
[im 1/72]
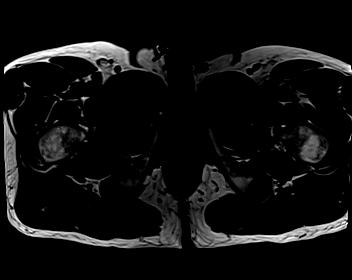
[im 72/72]
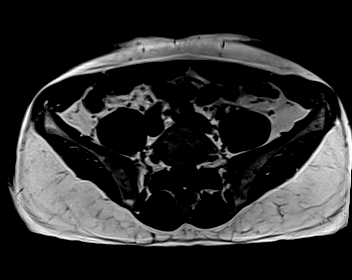

[Series 14: t1_vibe_dixon_tra_w · axial · 2.5mm · 0.91mm/px · z∈[-54,+124]mm · 2 of 72 slices shown]
[im 1/72]
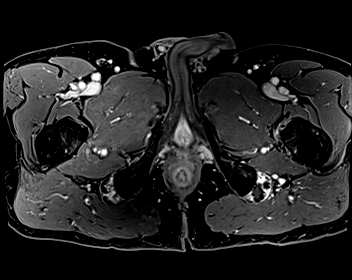
[im 72/72]
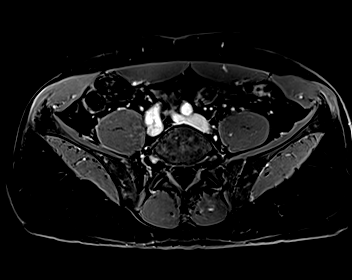

[48 of 48 positions shown; findings below may reference images not displayed]

FINDINGS: Prostate:

-- Peripheral Zone: No focal lesion seen on ADC or high b-value DWI
sequences.

-- Transition/Central Zone: Moderately enlarged with encapsulated
BPH nodules noted; however, no suspicious nodules with obscured
margins or significantly restricted diffusion seen.

-- Measurements/Volume:  6.3 by 5.1 x 6.0 cm (volume = 100 cm^3)

Transcapsular spread:  Absent

Seminal vesicle involvement:  Absent

Neurovascular bundle involvement:  Absent

Pelvic adenopathy: None visualized

Bone metastasis: None visualized

Other: Mild diffuse bladder wall thickening, consistent with chronic
bladder outlet obstruction. Sigmoid diverticulosis, without evidence
of diverticulitis.
IMPRESSION: No radiographic evidence of high-grade prostate carcinoma. PI-RADS 1
(v2.1): Very Low (clinically significant cancer highly unlikely)

## 2022-07-24 DIAGNOSIS — C61 Malignant neoplasm of prostate: Secondary | ICD-10-CM | POA: Diagnosis not present

## 2022-07-28 DIAGNOSIS — Z23 Encounter for immunization: Secondary | ICD-10-CM | POA: Diagnosis not present

## 2022-08-07 DIAGNOSIS — M25562 Pain in left knee: Secondary | ICD-10-CM | POA: Insufficient documentation

## 2022-08-10 DIAGNOSIS — M222X2 Patellofemoral disorders, left knee: Secondary | ICD-10-CM | POA: Diagnosis not present

## 2022-08-10 DIAGNOSIS — M25562 Pain in left knee: Secondary | ICD-10-CM | POA: Diagnosis not present

## 2022-08-22 ENCOUNTER — Other Ambulatory Visit: Payer: Self-pay | Admitting: Urology

## 2022-08-22 DIAGNOSIS — C61 Malignant neoplasm of prostate: Secondary | ICD-10-CM

## 2022-09-16 ENCOUNTER — Ambulatory Visit
Admission: RE | Admit: 2022-09-16 | Discharge: 2022-09-16 | Disposition: A | Discharge status: Home / Self Care | Payer: Medicare Other | Source: Ambulatory Visit | Attending: Urology | Admitting: Urology

## 2022-09-16 DIAGNOSIS — C61 Malignant neoplasm of prostate: Secondary | ICD-10-CM

## 2022-09-16 MED ORDER — GADOPICLENOL 0.5 MMOL/ML IV SOLN
7.5000 mL | Freq: Once | INTRAVENOUS | Status: AC | PRN
  Administered 2022-09-16: 7.5 mL via INTRAVENOUS

## 2022-09-29 DIAGNOSIS — R7989 Other specified abnormal findings of blood chemistry: Secondary | ICD-10-CM | POA: Diagnosis not present

## 2022-09-29 DIAGNOSIS — I10 Essential (primary) hypertension: Secondary | ICD-10-CM | POA: Diagnosis not present

## 2022-09-29 DIAGNOSIS — E785 Hyperlipidemia, unspecified: Secondary | ICD-10-CM | POA: Diagnosis not present

## 2022-10-19 DIAGNOSIS — H52203 Unspecified astigmatism, bilateral: Secondary | ICD-10-CM | POA: Diagnosis not present

## 2022-10-19 DIAGNOSIS — H5203 Hypermetropia, bilateral: Secondary | ICD-10-CM | POA: Diagnosis not present

## 2022-10-19 DIAGNOSIS — H524 Presbyopia: Secondary | ICD-10-CM | POA: Diagnosis not present

## 2022-10-19 DIAGNOSIS — H25813 Combined forms of age-related cataract, bilateral: Secondary | ICD-10-CM | POA: Diagnosis not present

## 2022-12-05 DIAGNOSIS — K080 Exfoliation of teeth due to systemic causes: Secondary | ICD-10-CM | POA: Diagnosis not present

## 2022-12-11 DIAGNOSIS — K080 Exfoliation of teeth due to systemic causes: Secondary | ICD-10-CM | POA: Diagnosis not present

## 2023-01-15 DIAGNOSIS — C61 Malignant neoplasm of prostate: Secondary | ICD-10-CM | POA: Diagnosis not present

## 2023-01-30 DIAGNOSIS — C61 Malignant neoplasm of prostate: Secondary | ICD-10-CM | POA: Diagnosis not present

## 2023-01-30 DIAGNOSIS — R972 Elevated prostate specific antigen [PSA]: Secondary | ICD-10-CM | POA: Diagnosis not present

## 2023-01-30 DIAGNOSIS — D075 Carcinoma in situ of prostate: Secondary | ICD-10-CM | POA: Diagnosis not present

## 2023-02-08 DIAGNOSIS — C61 Malignant neoplasm of prostate: Secondary | ICD-10-CM | POA: Diagnosis not present

## 2023-03-13 ENCOUNTER — Encounter: Payer: Self-pay | Admitting: Internal Medicine

## 2023-04-03 DIAGNOSIS — R7989 Other specified abnormal findings of blood chemistry: Secondary | ICD-10-CM | POA: Diagnosis not present

## 2023-04-03 DIAGNOSIS — I2510 Atherosclerotic heart disease of native coronary artery without angina pectoris: Secondary | ICD-10-CM | POA: Diagnosis not present

## 2023-04-03 DIAGNOSIS — R5383 Other fatigue: Secondary | ICD-10-CM | POA: Diagnosis not present

## 2023-04-03 DIAGNOSIS — E785 Hyperlipidemia, unspecified: Secondary | ICD-10-CM | POA: Diagnosis not present

## 2023-04-03 DIAGNOSIS — Z125 Encounter for screening for malignant neoplasm of prostate: Secondary | ICD-10-CM | POA: Diagnosis not present

## 2023-04-10 DIAGNOSIS — Z0000 Encounter for general adult medical examination without abnormal findings: Secondary | ICD-10-CM | POA: Diagnosis not present

## 2023-04-10 DIAGNOSIS — R82998 Other abnormal findings in urine: Secondary | ICD-10-CM | POA: Diagnosis not present

## 2023-04-10 DIAGNOSIS — C61 Malignant neoplasm of prostate: Secondary | ICD-10-CM | POA: Diagnosis not present

## 2023-04-10 DIAGNOSIS — Z23 Encounter for immunization: Secondary | ICD-10-CM | POA: Diagnosis not present

## 2023-04-16 ENCOUNTER — Encounter: Payer: Self-pay | Admitting: Internal Medicine

## 2023-04-16 ENCOUNTER — Ambulatory Visit (AMBULATORY_SURGERY_CENTER): Payer: Medicare Other

## 2023-04-16 VITALS — Ht 68.0 in | Wt 171.0 lb

## 2023-04-16 DIAGNOSIS — Z86010 Personal history of colonic polyps: Secondary | ICD-10-CM

## 2023-04-16 MED ORDER — NA SULFATE-K SULFATE-MG SULF 17.5-3.13-1.6 GM/177ML PO SOLN
1.0000 | Freq: Once | ORAL | 0 refills | Status: AC
Start: 2023-04-16 — End: 2023-04-16

## 2023-04-16 NOTE — Progress Notes (Signed)

## 2023-04-27 DIAGNOSIS — G8929 Other chronic pain: Secondary | ICD-10-CM | POA: Diagnosis not present

## 2023-05-07 ENCOUNTER — Encounter: Payer: Self-pay | Admitting: Internal Medicine

## 2023-05-15 ENCOUNTER — Encounter: Payer: Self-pay | Admitting: Internal Medicine

## 2023-05-15 ENCOUNTER — Ambulatory Visit (AMBULATORY_SURGERY_CENTER): Payer: Medicare Other | Admitting: Internal Medicine

## 2023-05-15 VITALS — BP 106/67 | HR 44 | Temp 98.9°F | Resp 13 | Ht 68.0 in | Wt 171.0 lb

## 2023-05-15 DIAGNOSIS — D123 Benign neoplasm of transverse colon: Secondary | ICD-10-CM

## 2023-05-15 DIAGNOSIS — Z09 Encounter for follow-up examination after completed treatment for conditions other than malignant neoplasm: Secondary | ICD-10-CM

## 2023-05-15 DIAGNOSIS — K635 Polyp of colon: Secondary | ICD-10-CM | POA: Diagnosis not present

## 2023-05-15 DIAGNOSIS — D120 Benign neoplasm of cecum: Secondary | ICD-10-CM

## 2023-05-15 DIAGNOSIS — Z86010 Personal history of colonic polyps: Secondary | ICD-10-CM | POA: Diagnosis not present

## 2023-05-15 MED ORDER — SODIUM CHLORIDE 0.9 % IV SOLN: 500.0000 mL | Freq: Once | INTRAVENOUS | Status: DC

## 2023-05-15 NOTE — Progress Notes (Signed)
 Called to room to assist during endoscopic procedure.  Patient ID and intended procedure confirmed with present staff. Received instructions for my participation in the procedure from the performing physician.  

## 2023-05-15 NOTE — Op Note (Signed)
[facility] Patient Name: Dylan Norton Procedure Date: 05/15/2023 7:51 AM MRN: [id number] Endoscopist: Wilhemina Bonito. Marina Goodell , MD, [id number] Age: 72 Referring MD:  Date of Birth: August 13, 1951 Gender: Male Account #: [id number] Procedure:                Colonoscopy with cold snare polypectomy x 1; biopsy                            polypectomy x 1 Indications:              High risk colon cancer surveillance: Personal                            history of multiple (3 or more) adenomas, High risk                            colon cancer surveillance: Personal history of                            sessile serrated colon polyp (less than 10 mm in                            size) with no dysplasia. Previous examinations                            2007, 2014, 2019 Medicines:                Monitored Anesthesia Care Procedure:                Pre-Anesthesia Assessment:                           - Prior to the procedure, a History and Physical                            was performed, and patient medications and                            allergies were reviewed. The patient's tolerance of                            previous anesthesia was also reviewed. The risks                            and benefits of the procedure and the sedation                            options and risks were discussed with the patient.                            All questions were answered, and informed consent                            was obtained. Prior Anticoagulants: The patient has  taken no anticoagulant or antiplatelet agents.                            After reviewing the risks and benefits, the patient                            was deemed in satisfactory condition to undergo the                            procedure.                           After obtaining informed consent, the colonoscope                            was passed under direct vision. Throughout the                             procedure, the patient's blood pressure, pulse, and                            oxygen saturations were monitored continuously. The                            Olympus Scope SN: [id number] was introduced through                            the anus and advanced to the the cecum, identified                            by appendiceal orifice and ileocecal valve. The                            ileocecal valve, appendiceal orifice, and rectum                            were photographed. The quality of the bowel                            preparation was excellent. The colonoscopy was                            performed without difficulty. The patient tolerated                            the procedure well. The bowel preparation used was                            SUPREP via split dose instruction. Scope In: 9:33:05 AM Scope Out: 9:50:01 AM Scope Withdrawal Time: 0 hours 10 minutes 48 seconds  Total Procedure Duration: 0 hours 16 minutes 56 seconds  Findings:                 A 4 mm polyp was found in the cecum. The polyp was  sessile. The polyp was removed with a cold snare.                            Resection and retrieval were complete.                           A 1 mm polyp was found in the transverse colon. The                            polyp was removed with a jumbo cold forceps.                            Resection and retrieval were complete.                           Multiple diverticula were found in the entire colon.                           A diffuse area of moderate melanosis was found in                            the entire colon.                           The exam was otherwise without abnormality on                            direct and retroflexion views. Complications:            No immediate complications. Estimated blood loss:                            None. Estimated Blood Loss:     Estimated blood loss: none. Impression:               -  One 4 mm polyp in the cecum, removed with a cold                            snare. Resected and retrieved.                           - One 1 mm polyp in the transverse colon, removed                            with a jumbo cold forceps. Resected and retrieved.                           - Diverticulosis in the entire examined colon.                           - Melanosis in the colon.                           - The examination was otherwise normal on direct  and retroflexion views. Recommendation:           - Repeat colonoscopy in 5 years for surveillance.                           - Patient has a contact number available for                            emergencies. The signs and symptoms of potential                            delayed complications were discussed with the                            patient. Return to normal activities tomorrow.                            Written discharge instructions were provided to the                            patient.                           - Resume previous diet.                           - Continue present medications.                           - Await pathology results. Wilhemina Bonito. Marina Goodell, MD 05/15/2023 10:07:27 AM This report has been signed electronically.

## 2023-05-15 NOTE — Progress Notes (Signed)
HISTORY OF PRESENT ILLNESS:  Dylan Norton is a 72 y.o. 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 He has never used smokeless tobacco. He reports current alcohol use. He reports that he does not use drugs.  family history includes Bladder Cancer in his father.  Allergies  Allergen Reactions   Codeine Rash       PHYSICAL EXAMINATION: Vital signs: BP (!) 149/87   Pulse (!) 53   Temp 98.9 F (37.2 C)   Ht 5\' 8"  (1.727 m)   Wt 171 lb (77.6 kg)   SpO2 98%   BMI 26.00 kg/m  General: Well-developed, well-nourished, no acute distress HEENT: Sclerae are anicteric, conjunctiva pink. Oral mucosa intact Lungs: Clear Heart: Regular Abdomen: soft, nontender, nondistended, no obvious ascites, no peritoneal signs, normal bowel sounds. No organomegaly. Extremities: No edema Psychiatric: alert and oriented x3. Cooperative     ASSESSMENT:  Personal history of SSP and TA's.   PLAN:   Surveillance colonoscopy

## 2023-05-15 NOTE — Progress Notes (Signed)
 Pt's states no medical or surgical changes since previsit or office visit. 

## 2023-05-15 NOTE — Progress Notes (Signed)
Sedate, gd SR, tolerated procedure well, VSS, report to RN 

## 2023-05-15 NOTE — Patient Instructions (Addendum)
Await pathology results.  Repeat colonoscopy in 5 years for surveillance.  Continue present medications.  Resume previous diet.  Handout on polyps and diverticulosis provided.  YOU HAD AN ENDOSCOPIC PROCEDURE TODAY AT THE [facility]:   Refer to the procedure report that was given to you for any specific questions about what was found during the examination.  If the procedure report does not answer your questions, please call your gastroenterologist to clarify.  If you requested that your care partner not be given the details of your procedure findings, then the procedure report has been included in a sealed envelope for you to review at your convenience later.  YOU SHOULD EXPECT: Some feelings of bloating in the abdomen. Passage of more gas than usual.  Walking can help get rid of the air that was put into your GI tract during the procedure and reduce the bloating. If you had a lower endoscopy (such as a colonoscopy or flexible sigmoidoscopy) you may notice spotting of blood in your stool or on the toilet paper. If you underwent a bowel prep for your procedure, you may not have a normal bowel movement for a few days.  Please Note:  You might notice some irritation and congestion in your nose or some drainage.  This is from the oxygen used during your procedure.  There is no need for concern and it should clear up in a day or so.  SYMPTOMS TO REPORT IMMEDIATELY:  Following lower endoscopy (colonoscopy or flexible sigmoidoscopy):  Excessive amounts of blood in the stool  Significant tenderness or worsening of abdominal pains  Swelling of the abdomen that is new, acute  Fever of 100F or higher   For urgent or emergent issues, a gastroenterologist can be reached at any hour by calling [phone]. Do not use MyChart messaging for urgent concerns.    DIET:  We do recommend a small meal at first, but then you may proceed to your regular diet.  Drink plenty of fluids but you  should avoid alcoholic beverages for 24 hours.  ACTIVITY:  You should plan to take it easy for the rest of today and you should NOT DRIVE or use heavy machinery until tomorrow (because of the sedation medicines used during the test).    FOLLOW UP: Our staff will call the number listed on your records the next business day following your procedure.  We will call around 7:15- 8:00 am to check on you and address any questions or concerns that you may have regarding the information given to you following your procedure. If we do not reach you, we will leave a message.     If any biopsies were taken you will be contacted by phone or by letter within the next 1-3 weeks.  Please call us at [phone] if you have not heard about the biopsies in 3 weeks.    SIGNATURES/CONFIDENTIALITY: You and/or your care partner have signed paperwork which will be entered into your electronic medical record.  These signatures attest to the fact that that the information above on your After Visit Summary has been reviewed and is understood.  Full responsibility of the confidentiality of this discharge information lies with you and/or your care-partner.

## 2023-05-16 ENCOUNTER — Telehealth: Payer: Self-pay | Admitting: *Deleted

## 2023-05-16 NOTE — Telephone Encounter (Signed)
No answer on  follow up call. Left message.   

## 2023-05-16 NOTE — Telephone Encounter (Signed)
  Follow up Call-     05/15/2023    8:02 AM  Call back number  Post procedure Call Back phone  # [phone]  Permission to leave phone message Yes     Patient questions:  Do you have a fever, pain , or abdominal swelling? No. Pain Score  0 *  Have you tolerated food without any problems? Yes.    Have you been able to return to your normal activities? Yes.    Do you have any questions about your discharge instructions: Diet   No. Medications  No. Follow up visit  No.  Do you have questions or concerns about your Care? No.  Actions: * If pain score is 4 or above: No action needed, pain <4.

## 2023-05-22 ENCOUNTER — Encounter: Payer: Self-pay | Admitting: Internal Medicine

## 2023-05-30 DIAGNOSIS — D225 Melanocytic nevi of trunk: Secondary | ICD-10-CM | POA: Diagnosis not present

## 2023-05-30 DIAGNOSIS — L570 Actinic keratosis: Secondary | ICD-10-CM | POA: Diagnosis not present

## 2023-05-30 DIAGNOSIS — D2262 Melanocytic nevi of left upper limb, including shoulder: Secondary | ICD-10-CM | POA: Diagnosis not present

## 2023-05-30 DIAGNOSIS — D2261 Melanocytic nevi of right upper limb, including shoulder: Secondary | ICD-10-CM | POA: Diagnosis not present

## 2023-05-30 DIAGNOSIS — L821 Other seborrheic keratosis: Secondary | ICD-10-CM | POA: Diagnosis not present

## 2023-06-13 DIAGNOSIS — K080 Exfoliation of teeth due to systemic causes: Secondary | ICD-10-CM | POA: Diagnosis not present

## 2023-06-28 DIAGNOSIS — Z23 Encounter for immunization: Secondary | ICD-10-CM | POA: Diagnosis not present

## 2023-07-19 DIAGNOSIS — Z23 Encounter for immunization: Secondary | ICD-10-CM | POA: Diagnosis not present

## 2023-08-02 DIAGNOSIS — R972 Elevated prostate specific antigen [PSA]: Secondary | ICD-10-CM | POA: Diagnosis not present

## 2023-09-19 DIAGNOSIS — K080 Exfoliation of teeth due to systemic causes: Secondary | ICD-10-CM | POA: Diagnosis not present

## 2023-09-26 DIAGNOSIS — M79674 Pain in right toe(s): Secondary | ICD-10-CM | POA: Diagnosis not present

## 2023-09-26 DIAGNOSIS — M2041 Other hammer toe(s) (acquired), right foot: Secondary | ICD-10-CM | POA: Diagnosis not present

## 2023-11-01 DIAGNOSIS — H2513 Age-related nuclear cataract, bilateral: Secondary | ICD-10-CM | POA: Diagnosis not present

## 2023-12-11 ENCOUNTER — Other Ambulatory Visit: Payer: Self-pay | Admitting: Urology

## 2023-12-11 DIAGNOSIS — C61 Malignant neoplasm of prostate: Secondary | ICD-10-CM

## 2023-12-20 DIAGNOSIS — K080 Exfoliation of teeth due to systemic causes: Secondary | ICD-10-CM | POA: Diagnosis not present

## 2024-01-23 ENCOUNTER — Telehealth: Payer: Self-pay | Admitting: Internal Medicine

## 2024-01-23 NOTE — Telephone Encounter (Signed)
 Error

## 2024-01-31 ENCOUNTER — Encounter: Payer: Self-pay | Admitting: Urology

## 2024-02-01 DIAGNOSIS — C61 Malignant neoplasm of prostate: Secondary | ICD-10-CM | POA: Diagnosis not present

## 2024-02-02 ENCOUNTER — Ambulatory Visit
Admission: RE | Admit: 2024-02-02 | Discharge: 2024-02-02 | Disposition: A | Discharge status: Home / Self Care | Source: Ambulatory Visit | Attending: Urology | Admitting: Urology

## 2024-02-02 DIAGNOSIS — R972 Elevated prostate specific antigen [PSA]: Secondary | ICD-10-CM | POA: Diagnosis not present

## 2024-02-02 DIAGNOSIS — C61 Malignant neoplasm of prostate: Secondary | ICD-10-CM

## 2024-02-02 MED ORDER — GADOPICLENOL 0.5 MMOL/ML IV SOLN
8.0000 mL | Freq: Once | INTRAVENOUS | Status: AC | PRN
  Administered 2024-02-02: 8 mL via INTRAVENOUS

## 2024-02-25 DIAGNOSIS — R351 Nocturia: Secondary | ICD-10-CM | POA: Diagnosis not present

## 2024-02-25 DIAGNOSIS — C61 Malignant neoplasm of prostate: Secondary | ICD-10-CM | POA: Diagnosis not present

## 2024-04-16 DIAGNOSIS — C61 Malignant neoplasm of prostate: Secondary | ICD-10-CM | POA: Diagnosis not present

## 2024-04-21 DIAGNOSIS — Z1212 Encounter for screening for malignant neoplasm of rectum: Secondary | ICD-10-CM | POA: Diagnosis not present

## 2024-04-21 DIAGNOSIS — C61 Malignant neoplasm of prostate: Secondary | ICD-10-CM | POA: Diagnosis not present

## 2024-04-21 DIAGNOSIS — E785 Hyperlipidemia, unspecified: Secondary | ICD-10-CM | POA: Diagnosis not present

## 2024-04-28 DIAGNOSIS — C61 Malignant neoplasm of prostate: Secondary | ICD-10-CM | POA: Diagnosis not present

## 2024-04-28 DIAGNOSIS — Z1339 Encounter for screening examination for other mental health and behavioral disorders: Secondary | ICD-10-CM | POA: Diagnosis not present

## 2024-04-28 DIAGNOSIS — Z0000 Encounter for general adult medical examination without abnormal findings: Secondary | ICD-10-CM | POA: Diagnosis not present

## 2024-04-28 DIAGNOSIS — R82998 Other abnormal findings in urine: Secondary | ICD-10-CM | POA: Diagnosis not present

## 2024-04-28 DIAGNOSIS — R7989 Other specified abnormal findings of blood chemistry: Secondary | ICD-10-CM | POA: Diagnosis not present

## 2024-04-28 DIAGNOSIS — Z1389 Encounter for screening for other disorder: Secondary | ICD-10-CM | POA: Diagnosis not present

## 2024-04-28 DIAGNOSIS — Z1331 Encounter for screening for depression: Secondary | ICD-10-CM | POA: Diagnosis not present

## 2024-05-29 DIAGNOSIS — L570 Actinic keratosis: Secondary | ICD-10-CM | POA: Diagnosis not present

## 2024-05-29 DIAGNOSIS — D225 Melanocytic nevi of trunk: Secondary | ICD-10-CM | POA: Diagnosis not present

## 2024-05-29 DIAGNOSIS — L738 Other specified follicular disorders: Secondary | ICD-10-CM | POA: Diagnosis not present

## 2024-05-29 DIAGNOSIS — L821 Other seborrheic keratosis: Secondary | ICD-10-CM | POA: Diagnosis not present

## 2024-05-29 DIAGNOSIS — D2372 Other benign neoplasm of skin of left lower limb, including hip: Secondary | ICD-10-CM | POA: Diagnosis not present

## 2024-05-29 DIAGNOSIS — C44719 Basal cell carcinoma of skin of left lower limb, including hip: Secondary | ICD-10-CM | POA: Diagnosis not present

## 2024-07-01 DIAGNOSIS — K080 Exfoliation of teeth due to systemic causes: Secondary | ICD-10-CM | POA: Diagnosis not present

## 2024-07-22 DIAGNOSIS — Z23 Encounter for immunization: Secondary | ICD-10-CM | POA: Diagnosis not present
